# Patient Record
Sex: Male | Born: 1995 | Hispanic: No | Marital: Single | Smoking: Never smoker
Health system: Southern US, Community
[De-identification: ages and names within clinical notes are randomized; demographics above are authoritative.]

---
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
## 2017-07-08 ENCOUNTER — Emergency Department (HOSPITAL_COMMUNITY): Payer: No Typology Code available for payment source

## 2017-07-08 ENCOUNTER — Emergency Department (HOSPITAL_COMMUNITY)
Admission: EM | Admit: 2017-07-08 | Discharge: 2017-07-08 | Disposition: A | Discharge status: Home / Self Care | Payer: No Typology Code available for payment source | Attending: Emergency Medicine | Admitting: Emergency Medicine

## 2017-07-08 ENCOUNTER — Encounter (HOSPITAL_COMMUNITY): Payer: Self-pay

## 2017-07-08 DIAGNOSIS — M25552 Pain in left hip: Secondary | ICD-10-CM | POA: Diagnosis not present

## 2017-07-08 DIAGNOSIS — Y999 Unspecified external cause status: Secondary | ICD-10-CM | POA: Insufficient documentation

## 2017-07-08 DIAGNOSIS — M25531 Pain in right wrist: Secondary | ICD-10-CM | POA: Diagnosis not present

## 2017-07-08 DIAGNOSIS — M542 Cervicalgia: Secondary | ICD-10-CM | POA: Insufficient documentation

## 2017-07-08 DIAGNOSIS — R51 Headache: Secondary | ICD-10-CM | POA: Insufficient documentation

## 2017-07-08 DIAGNOSIS — Y92410 Unspecified street and highway as the place of occurrence of the external cause: Secondary | ICD-10-CM | POA: Diagnosis not present

## 2017-07-08 DIAGNOSIS — Y9389 Activity, other specified: Secondary | ICD-10-CM | POA: Insufficient documentation

## 2017-07-08 MED ORDER — NAPROXEN 500 MG PO TABS: 500.0000 mg | ORAL_TABLET | Freq: Two times a day (BID) | ORAL | 0 refills | Status: AC

## 2017-07-08 NOTE — Discharge Instructions (Addendum)
Take medication as needed for pain. Apply heat/cold to right wrist for swelling. If symptoms worsen please return to ED.Follow up with PCP.
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
## 2020-07-11 ENCOUNTER — Ambulatory Visit
Admission: RE | Admit: 2020-07-11 | Discharge: 2020-07-11 | Disposition: A | Discharge status: Home / Self Care | Payer: No Typology Code available for payment source | Source: Ambulatory Visit | Attending: Nurse Practitioner | Admitting: Nurse Practitioner

## 2020-07-11 ENCOUNTER — Other Ambulatory Visit: Payer: Self-pay | Admitting: Nurse Practitioner

## 2020-07-11 DIAGNOSIS — Z021 Encounter for pre-employment examination: Secondary | ICD-10-CM

## 2022-06-16 IMAGING — CR DG CHEST 1V
1 series · 1 of 1 positions shown · non-contrast
Comparison: None.

CLINICAL DATA: 25-year-old male with pre-employment chest x-ray

EXAM:
CHEST  1 VIEW

[w chest pa]
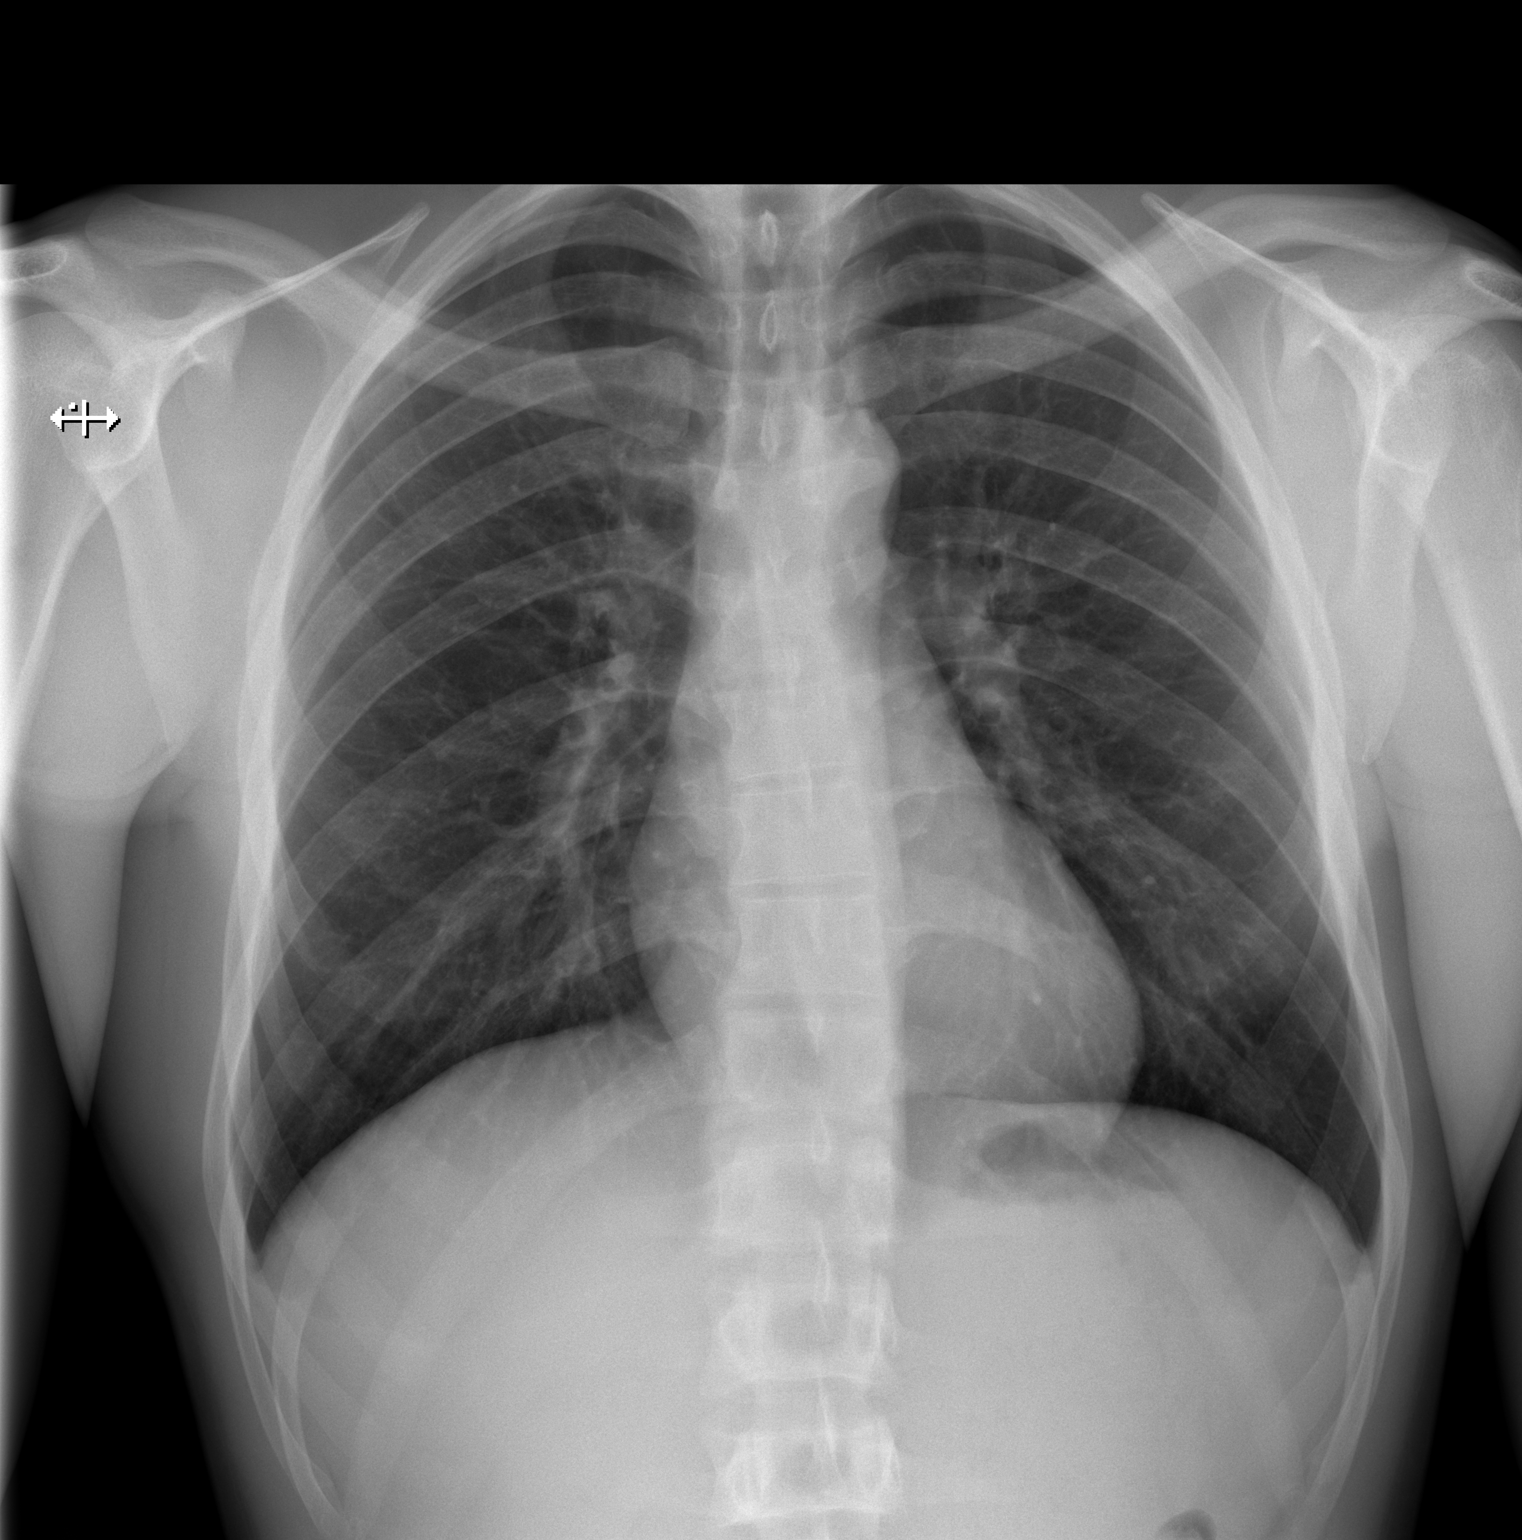

[1 of 1 positions shown; findings below may reference images not displayed]

FINDINGS: Cardiomediastinal silhouette within normal limits in size and
contour. No evidence of central vascular congestion. No interlobular
septal thickening. No pneumothorax or pleural effusion. No confluent
airspace disease.

No acute displaced fracture.

There is slight scoliotic curvature of the midthoracic spine. The
right aspect of the T7 vertebral body is not well visualized.
IMPRESSION: Negative for acute cardiopulmonary disease.

The right aspect of the T7 vertebral body is not well visualized.
This may be artifactual appearance given the overlying soft tissues,
however, as there is slight scoliotic curvature at this location,
further evaluation with two-view thoracic spine plain film
recommended for further evaluation.
